# Patient Record
Sex: Female | Born: 1980 | Hispanic: Yes | Marital: Single | Smoking: Never smoker
Health system: Southern US, Community
[De-identification: ages and names within clinical notes are randomized; demographics above are authoritative.]

## PROBLEM LIST (undated history)

## (undated) DIAGNOSIS — J45909 Unspecified asthma, uncomplicated: Secondary | ICD-10-CM

---

## 2019-11-30 ENCOUNTER — Other Ambulatory Visit: Payer: Self-pay

## 2019-11-30 ENCOUNTER — Ambulatory Visit
Admission: RE | Admit: 2019-11-30 | Discharge: 2019-11-30 | Disposition: A | Discharge status: Home / Self Care | Payer: Self-pay | Source: Ambulatory Visit | Attending: Urgent Care | Admitting: Urgent Care

## 2019-11-30 VITALS — BP 111/74 | HR 64 | Temp 98.5°F | Resp 18

## 2019-11-30 DIAGNOSIS — R519 Headache, unspecified: Secondary | ICD-10-CM

## 2019-11-30 DIAGNOSIS — R110 Nausea: Secondary | ICD-10-CM

## 2019-11-30 DIAGNOSIS — R0602 Shortness of breath: Secondary | ICD-10-CM

## 2019-11-30 DIAGNOSIS — J019 Acute sinusitis, unspecified: Secondary | ICD-10-CM

## 2019-11-30 DIAGNOSIS — R52 Pain, unspecified: Secondary | ICD-10-CM

## 2019-11-30 HISTORY — DX: Unspecified asthma, uncomplicated: J45.909

## 2019-11-30 MED ORDER — AMOXICILLIN 875 MG PO TABS: 875.0000 mg | ORAL_TABLET | Freq: Two times a day (BID) | ORAL | 0 refills | Status: AC

## 2019-11-30 MED ORDER — PREDNISONE 20 MG PO TABS: 20.0000 mg | ORAL_TABLET | Freq: Every day | ORAL | 0 refills | Status: DC

## 2019-11-30 NOTE — ED Provider Notes (Signed)
[facility]   MRN: [id number] DOB: 01/20/1981  Subjective:   Melanie Wu is a 39 y.o. female presenting for 2 week hx of acute onset persistent body aches, headaches, fevers. Initially lost sense of taste and smell, nausea, shob. Has had some improvement in latter symptoms. Still gets shob when she does strenuous work activities.  Has not had COVID vaccination.  Has a history of COVID-19 about 1 year ago.  No current facility-administered medications for this encounter. No current outpatient medications on file.   No Known Allergies  Past Medical History:  Diagnosis Date  . Asthma      History reviewed. No pertinent surgical history.  Family History  Family history unknown: Yes    Social History   Tobacco Use  . Smoking status: Never Smoker  . Smokeless tobacco: Never Used  Substance Use Topics  . Alcohol use: Not Currently  . Drug use: Never    ROS   Objective:   Vitals: BP 111/74 (BP Location: Left Arm)   Pulse 64   Temp 98.5 F (36.9 C) (Oral)   Resp 18   SpO2 97%   Physical Exam Constitutional:      General: She is not in acute distress.    Appearance: Normal appearance. She is well-developed. She is not ill-appearing, toxic-appearing or diaphoretic.  HENT:     Head: Normocephalic and atraumatic.     Right Ear: External ear normal.     Left Ear: External ear normal.     Nose: Nose normal.     Comments: Frontal and maxillary sinus tenderness bilaterally.    Mouth/Throat:     Mouth: Mucous membranes are moist.  Eyes:     General: No scleral icterus.       Right eye: No discharge.        Left eye: No discharge.     Extraocular Movements: Extraocular movements intact.     Pupils: Pupils are equal, round, and reactive to light.  Cardiovascular:     Rate and Rhythm: Normal rate and regular rhythm.     Pulses: Normal pulses.     Heart sounds: Normal heart sounds. No murmur heard.  No friction rub. No gallop.   Pulmonary:     Effort:  Pulmonary effort is normal. No respiratory distress.     Breath sounds: Normal breath sounds. No stridor. No wheezing, rhonchi or rales.  Skin:    General: Skin is warm and dry.     Findings: No rash.  Neurological:     Mental Status: She is alert and oriented to person, place, and time.     Cranial Nerves: No cranial nerve deficit.     Motor: No weakness.     Coordination: Coordination normal.     Gait: Gait normal.     Deep Tendon Reflexes: Reflexes normal.  Psychiatric:        Mood and Affect: Mood normal.        Behavior: Behavior normal.        Thought Content: Thought content normal.        Judgment: Judgment normal.     Assessment and Plan :   PDMP not reviewed this encounter.  1. Acute sinusitis, recurrence not specified, unspecified location   2. Body aches   3. Generalized headaches   4. Nausea without vomiting   5. Frontal headache   6. Shortness of breath     Patient had COVID-19 about a year ago.  I suspect that she had  a recurrent infection.  She was agreeable to testing.  Also recommended covering for secondary sinus infection with amoxicillin, using prednisone for her allergic rhinitis.  Maintain Zyrtec.  Use supportive care otherwise. Counseled patient on potential for adverse effects with medications prescribed/recommended today, ER and return-to-clinic precautions discussed, patient verbalized understanding.    Wallis Bamberg, [place] 11/30/19 1657

## 2019-11-30 NOTE — ED Triage Notes (Signed)
Pt here for body aches with HA and subjective fever

## 2019-12-02 LAB — SARS-COV-2, NAA 2 DAY TAT

## 2019-12-02 LAB — NOVEL CORONAVIRUS, NAA: SARS-CoV-2, NAA: NOT DETECTED

## 2020-10-16 ENCOUNTER — Other Ambulatory Visit: Payer: Self-pay

## 2020-10-16 ENCOUNTER — Encounter (HOSPITAL_COMMUNITY): Payer: Self-pay

## 2020-10-16 ENCOUNTER — Ambulatory Visit (HOSPITAL_COMMUNITY)
Admission: EM | Admit: 2020-10-16 | Discharge: 2020-10-16 | Disposition: A | Discharge status: Home / Self Care | Payer: Self-pay | Attending: Medical Oncology | Admitting: Medical Oncology

## 2020-10-16 ENCOUNTER — Ambulatory Visit (INDEPENDENT_AMBULATORY_CARE_PROVIDER_SITE_OTHER): Payer: Self-pay

## 2020-10-16 DIAGNOSIS — R079 Chest pain, unspecified: Secondary | ICD-10-CM

## 2020-10-16 DIAGNOSIS — M549 Dorsalgia, unspecified: Secondary | ICD-10-CM

## 2020-10-16 DIAGNOSIS — R0781 Pleurodynia: Secondary | ICD-10-CM

## 2020-10-16 MED ORDER — METHOCARBAMOL 500 MG PO TABS: 500.0000 mg | ORAL_TABLET | Freq: Two times a day (BID) | ORAL | 0 refills | Status: AC

## 2020-10-16 MED ORDER — PREDNISONE 10 MG (21) PO TBPK: ORAL_TABLET | Freq: Every day | ORAL | 0 refills | Status: AC

## 2020-10-16 NOTE — ED Provider Notes (Addendum)
[facility]    CSN: [id number] Arrival date & time: 10/16/20  1035      History   Chief Complaint Chief Complaint  Patient presents with   Back Pain    HPI Melanie Wu is a 40 y.o. female.  Interpreter Norva Pavlov helped with our visit today virtually  HPI  Back Pain: Patient states that she has had mid back pain for about a month off and on.  She states that the pain is a throbbing shooting pain.  She states that she gets the pain if she bends her body forward or takes a deep breath in.  She also gets pain if she flexes her neck.  Pain radiates from the left mid back around to the right rib area.  No known significant injury. No fever, cough, loss of grip strength, She has tried naproxen, Toradol with some improvement. She asks for imaging. [facility]: 2 weeks ago  Past Medical History:  Diagnosis Date   Asthma     There are no problems to display for this patient.   History reviewed. No pertinent surgical history.  OB History   No obstetric history on file.      Home Medications    Prior to Admission medications   Medication Sig Start Date End Date Taking? Authorizing Provider  amoxicillin (AMOXIL) 875 MG tablet Take 1 tablet (875 mg total) by mouth 2 (two) times daily. 11/30/19   Wallis Bamberg, PA-C  predniSONE (DELTASONE) 20 MG tablet Take 1 tablet (20 mg total) by mouth daily with breakfast. 11/30/19   Wallis Bamberg, PA-C    Family History Family History  Family history unknown: Yes    Social History Social History   Tobacco Use   Smoking status: Never   Smokeless tobacco: Never  Substance Use Topics   Alcohol use: Not Currently   Drug use: Never     Allergies   Patient has no known allergies.   Review of Systems Review of Systems  As stated above in HPI Physical Exam Triage Vital Signs ED Triage Vitals [10/16/20 1236]  Enc Vitals Group     BP (!) 118/103     Pulse Rate 68     Resp 16     Temp 98.8 F (37.1 C)     Temp Source  Oral     SpO2 100 %     Weight      Height      Head Circumference      Peak Flow      Pain Score      Pain Loc      Pain Edu?      Excl. in GC?    No data found.  Updated Vital Signs BP (!) 118/103 (BP Location: Left Arm)   Pulse 68   Temp 98.8 F (37.1 C) (Oral)   Resp 16   LMP  (LMP Unknown)   SpO2 100%   Physical Exam Vitals and nursing note reviewed.  Constitutional:      General: She is not in acute distress.    Appearance: Normal appearance. She is not ill-appearing, toxic-appearing or diaphoretic.  HENT:     Head: Normocephalic and atraumatic.  Neck:     Comments: Pain with flexion Cardiovascular:     Rate and Rhythm: Normal rate and regular rhythm.     Pulses: Normal pulses.     Heart sounds: Normal heart sounds.  Pulmonary:     Effort: Pulmonary effort is normal.  Breath sounds: Normal breath sounds.  Musculoskeletal:        General: Tenderness (cervical and thoracic spine at midline as well as mid thoracic reproducible muscle pain) present. No deformity.     Cervical back: Neck supple. No rigidity or tenderness.  Skin:    General: Skin is warm.     Findings: No rash.  Neurological:     Mental Status: She is alert and oriented to person, place, and time.     Sensory: No sensory deficit.     Motor: No weakness.     Coordination: Coordination normal.     Deep Tendon Reflexes: Reflexes normal.     UC Treatments / Results  Labs (all labs ordered are listed, but only abnormal results are displayed) Labs Reviewed - No data to display  EKG   Radiology No results found.  Procedures Procedures (including critical care time)  Medications Ordered in UC Medications - No data to display  Initial Impression / Assessment and Plan / UC Course  I have reviewed the triage vital signs and the nursing notes.  Pertinent labs & imaging results that were available during my care of the patient were reviewed by me and considered in my medical decision  making (see chart for details).     New.  Imaging pending.  If her images are all essentially normal we are going to treat with prednisone and a muscle relaxer along with NSAID.  She would be encouraged to follow-up with orthopedics. Discussed red flag signs and symptoms.   Update: No acute findings on x-ray imaging. Final Clinical Impressions(s) / UC Diagnoses   Final diagnoses:  None   Discharge Instructions   None    ED Prescriptions   None    PDMP not reviewed this encounter.   Rushie Chestnut, PA-C 10/16/20 1300    Rushie Chestnut, PA-C 10/16/20 1304    [address] PA-C 10/16/20 1337

## 2020-10-16 NOTE — ED Triage Notes (Signed)
Pt present mid back pain, symptom started about a month ago.
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
